# Patient Record
Sex: Female | Born: 1940
Health system: Southern US, Community
[De-identification: ages and names within clinical notes are randomized; demographics above are authoritative.]

---

## 2002-05-25 ENCOUNTER — Emergency Department (HOSPITAL_COMMUNITY): Admission: EM | Admit: 2002-05-25 | Discharge: 2002-05-25 | Payer: Self-pay | Admitting: Emergency Medicine

## 2002-07-27 ENCOUNTER — Emergency Department (HOSPITAL_COMMUNITY): Admission: EM | Admit: 2002-07-27 | Discharge: 2002-07-27 | Payer: Self-pay | Admitting: Emergency Medicine

## 2002-07-27 ENCOUNTER — Encounter: Payer: Self-pay | Admitting: Emergency Medicine

## 2004-08-01 ENCOUNTER — Ambulatory Visit (HOSPITAL_COMMUNITY): Admission: RE | Admit: 2004-08-01 | Discharge: 2004-08-01 | Payer: Self-pay | Admitting: Gastroenterology

## 2004-09-01 ENCOUNTER — Emergency Department (HOSPITAL_COMMUNITY): Admission: EM | Admit: 2004-09-01 | Discharge: 2004-09-02 | Payer: Self-pay | Admitting: Emergency Medicine

## 2004-11-20 ENCOUNTER — Emergency Department (HOSPITAL_COMMUNITY): Admission: EM | Admit: 2004-11-20 | Discharge: 2004-11-20 | Payer: Self-pay | Admitting: Emergency Medicine

## 2008-03-03 ENCOUNTER — Encounter: Admission: RE | Admit: 2008-03-03 | Discharge: 2008-03-03 | Payer: Self-pay | Admitting: Sports Medicine

## 2008-07-29 ENCOUNTER — Encounter: Admission: RE | Admit: 2008-07-29 | Discharge: 2008-07-29 | Payer: Self-pay | Admitting: Sports Medicine

## 2008-08-10 ENCOUNTER — Encounter: Admission: RE | Admit: 2008-08-10 | Discharge: 2008-08-10 | Payer: Self-pay | Admitting: Sports Medicine

## 2010-08-03 NOTE — Op Note (Signed)
NAME:  Autumn Cunningham, Autumn Cunningham NO.:  [id number]   MEDICAL RECORD NO.:  [id number]          PATIENT TYPE:  AMB   LOCATION:  ENDO                         FACILITY:  [facility]   PHYSICIAN:  Anselmo Rod, M.D.  DATE OF BIRTH:  12-15-1940   DATE OF PROCEDURE:  08/01/2004  DATE OF DISCHARGE:                                 OPERATIVE REPORT   PROCEDURE PERFORMED:  Screening colonoscopy.   ENDOSCOPIST:  Anselmo Rod, M.D.   INSTRUMENT USED:  Olympus video colonoscope.   INDICATION FOR PROCEDURE:  A 70 year old white female with a family history  of colon cancer undergoing screening colonoscopy to rule out colonic polyps,  masses, etc.   PREPROCEDURE PREPARATION:  Informed consent was procured from the patient.  The patient was fasted for eight hours prior to the procedure and prepped  with a bottle of magnesium citrate and a gallon of GoLYTELY the night prior  to the procedure.  The risks and benefits of the procedure, including a 10%  miss rate of cancer and polyps, were discussed with the patient as well.   PREPROCEDURE PHYSICAL:  VITAL SIGNS:  The patient had stable vital signs.  NECK:  Supple.  CHEST:  Clear to auscultation.  S1, S2 regular.  ABDOMEN:  Soft with normal bowel sounds.   DESCRIPTION OF PROCEDURE:  The patient was placed in the left lateral  decubitus position and sedated with 75 mg of Demerol and 5 mg of Versed in  slow incremental doses.  Once the patient was adequately sedate and  maintained on low-flow oxygen and continuous cardiac monitoring, the Olympus  video colonoscope was advanced from the rectum to the cecum.  There was some  residual stool in the colon.  Multiple washes were done.  No masses, polyps,  erosions, ulcerations or diverticula were seen.  Small internal hemorrhoids  were appreciated on retroflexion in the rectum.  The patient tolerated the  procedure well without complications.   IMPRESSION:  1. Unrevealing colonoscopy up to the  cecum except for small internal      hemorrhoids.  2. Some residual stool in the colon, very small lesions could be missed.     RECOMMENDATIONS:  1. Continue a high-fiber diet with liberal fluid intake.  2. Repeat colonoscopy in the next five years unless the patient develops      any abnormal symptoms in the interim.  3. Outpatient follow-up as the need arises in the future.        JNM/MEDQ  D:  08/01/2004  T:  08/01/2004  Job:  [id number]   cc:   Teena Irani. Arlyce Dice, M.D.  [address]  [place]  [place] [address]  Fax: [phone]   Rema Fendt, P.A.-C.  [facility]

## 2011-01-17 ENCOUNTER — Other Ambulatory Visit: Payer: Self-pay | Admitting: Sports Medicine

## 2011-01-17 DIAGNOSIS — M5416 Radiculopathy, lumbar region: Secondary | ICD-10-CM

## 2011-01-21 ENCOUNTER — Ambulatory Visit
Admission: RE | Admit: 2011-01-21 | Discharge: 2011-01-21 | Disposition: A | Discharge status: Home / Self Care | Payer: Medicare Other | Source: Ambulatory Visit | Attending: Sports Medicine | Admitting: Sports Medicine

## 2011-01-21 DIAGNOSIS — M5416 Radiculopathy, lumbar region: Secondary | ICD-10-CM

## 2011-01-21 MED ORDER — METHYLPREDNISOLONE ACETATE 40 MG/ML INJ SUSP (RADIOLOG
120.0000 mg | Freq: Once | INTRAMUSCULAR | Status: AC
  Administered 2011-01-21: 120 mg via EPIDURAL

## 2011-01-21 MED ORDER — IOHEXOL 180 MG/ML  SOLN
1.0000 mL | Freq: Once | INTRAMUSCULAR | Status: AC | PRN
  Administered 2011-01-21: 1 mL via EPIDURAL

## 2011-05-07 ENCOUNTER — Other Ambulatory Visit: Payer: Self-pay | Admitting: Sports Medicine

## 2011-05-07 DIAGNOSIS — M5416 Radiculopathy, lumbar region: Secondary | ICD-10-CM

## 2011-05-13 ENCOUNTER — Ambulatory Visit
Admission: RE | Admit: 2011-05-13 | Discharge: 2011-05-13 | Disposition: A | Discharge status: Home / Self Care | Payer: Medicare Other | Source: Ambulatory Visit | Attending: Sports Medicine | Admitting: Sports Medicine

## 2011-05-13 DIAGNOSIS — M5416 Radiculopathy, lumbar region: Secondary | ICD-10-CM

## 2012-02-10 ENCOUNTER — Other Ambulatory Visit: Payer: Self-pay | Admitting: Orthopedic Surgery

## 2012-02-10 ENCOUNTER — Ambulatory Visit (INDEPENDENT_AMBULATORY_CARE_PROVIDER_SITE_OTHER): Payer: Medicare Other

## 2012-02-10 DIAGNOSIS — R52 Pain, unspecified: Secondary | ICD-10-CM

## 2012-02-10 DIAGNOSIS — R29898 Other symptoms and signs involving the musculoskeletal system: Secondary | ICD-10-CM

## 2012-02-10 DIAGNOSIS — M4120 Other idiopathic scoliosis, site unspecified: Secondary | ICD-10-CM

## 2012-02-10 DIAGNOSIS — M545 Low back pain: Secondary | ICD-10-CM

## 2014-05-18 DIAGNOSIS — Z1589 Genetic susceptibility to other disease: Secondary | ICD-10-CM | POA: Diagnosis not present

## 2014-05-18 DIAGNOSIS — M79643 Pain in unspecified hand: Secondary | ICD-10-CM | POA: Diagnosis not present

## 2014-05-18 DIAGNOSIS — M2550 Pain in unspecified joint: Secondary | ICD-10-CM | POA: Diagnosis not present

## 2014-05-24 DIAGNOSIS — N390 Urinary tract infection, site not specified: Secondary | ICD-10-CM | POA: Diagnosis not present

## 2014-05-27 DIAGNOSIS — M79643 Pain in unspecified hand: Secondary | ICD-10-CM | POA: Diagnosis not present

## 2014-05-27 DIAGNOSIS — Z1589 Genetic susceptibility to other disease: Secondary | ICD-10-CM | POA: Diagnosis not present

## 2014-05-27 DIAGNOSIS — M2550 Pain in unspecified joint: Secondary | ICD-10-CM | POA: Diagnosis not present

## 2014-06-06 DIAGNOSIS — N390 Urinary tract infection, site not specified: Secondary | ICD-10-CM | POA: Diagnosis not present

## 2014-06-07 DIAGNOSIS — M79643 Pain in unspecified hand: Secondary | ICD-10-CM | POA: Diagnosis not present

## 2014-06-07 DIAGNOSIS — Z1589 Genetic susceptibility to other disease: Secondary | ICD-10-CM | POA: Diagnosis not present

## 2014-06-07 DIAGNOSIS — M2550 Pain in unspecified joint: Secondary | ICD-10-CM | POA: Diagnosis not present

## 2014-08-02 DIAGNOSIS — M79643 Pain in unspecified hand: Secondary | ICD-10-CM | POA: Diagnosis not present

## 2014-08-02 DIAGNOSIS — M2550 Pain in unspecified joint: Secondary | ICD-10-CM | POA: Diagnosis not present

## 2014-08-02 DIAGNOSIS — M4690 Unspecified inflammatory spondylopathy, site unspecified: Secondary | ICD-10-CM | POA: Diagnosis not present

## 2014-08-02 DIAGNOSIS — Z1589 Genetic susceptibility to other disease: Secondary | ICD-10-CM | POA: Diagnosis not present

## 2014-08-05 DIAGNOSIS — M810 Age-related osteoporosis without current pathological fracture: Secondary | ICD-10-CM | POA: Diagnosis not present

## 2014-08-05 DIAGNOSIS — I10 Essential (primary) hypertension: Secondary | ICD-10-CM | POA: Diagnosis not present

## 2014-08-05 DIAGNOSIS — Z23 Encounter for immunization: Secondary | ICD-10-CM | POA: Diagnosis not present

## 2014-08-05 DIAGNOSIS — R7309 Other abnormal glucose: Secondary | ICD-10-CM | POA: Diagnosis not present

## 2014-08-05 DIAGNOSIS — Z0000 Encounter for general adult medical examination without abnormal findings: Secondary | ICD-10-CM | POA: Diagnosis not present

## 2014-08-05 DIAGNOSIS — E780 Pure hypercholesterolemia: Secondary | ICD-10-CM | POA: Diagnosis not present

## 2014-08-05 DIAGNOSIS — Z1289 Encounter for screening for malignant neoplasm of other sites: Secondary | ICD-10-CM | POA: Diagnosis not present

## 2014-08-05 DIAGNOSIS — Z86010 Personal history of colonic polyps: Secondary | ICD-10-CM | POA: Diagnosis not present

## 2014-08-05 DIAGNOSIS — Z862 Personal history of diseases of the blood and blood-forming organs and certain disorders involving the immune mechanism: Secondary | ICD-10-CM | POA: Diagnosis not present

## 2014-10-04 DIAGNOSIS — M4690 Unspecified inflammatory spondylopathy, site unspecified: Secondary | ICD-10-CM | POA: Diagnosis not present

## 2014-10-04 DIAGNOSIS — M2550 Pain in unspecified joint: Secondary | ICD-10-CM | POA: Diagnosis not present

## 2014-10-04 DIAGNOSIS — Z1589 Genetic susceptibility to other disease: Secondary | ICD-10-CM | POA: Diagnosis not present

## 2014-10-04 DIAGNOSIS — M25569 Pain in unspecified knee: Secondary | ICD-10-CM | POA: Diagnosis not present

## 2014-11-25 DIAGNOSIS — Z23 Encounter for immunization: Secondary | ICD-10-CM | POA: Diagnosis not present

## 2014-12-01 DIAGNOSIS — Z1231 Encounter for screening mammogram for malignant neoplasm of breast: Secondary | ICD-10-CM | POA: Diagnosis not present

## 2014-12-23 DIAGNOSIS — H524 Presbyopia: Secondary | ICD-10-CM | POA: Diagnosis not present

## 2014-12-23 DIAGNOSIS — H35353 Cystoid macular degeneration, bilateral: Secondary | ICD-10-CM | POA: Diagnosis not present

## 2014-12-23 DIAGNOSIS — H04123 Dry eye syndrome of bilateral lacrimal glands: Secondary | ICD-10-CM | POA: Diagnosis not present

## 2015-01-02 DIAGNOSIS — M4690 Unspecified inflammatory spondylopathy, site unspecified: Secondary | ICD-10-CM | POA: Diagnosis not present

## 2015-01-02 DIAGNOSIS — Z79899 Other long term (current) drug therapy: Secondary | ICD-10-CM | POA: Diagnosis not present

## 2015-01-02 DIAGNOSIS — M2550 Pain in unspecified joint: Secondary | ICD-10-CM | POA: Diagnosis not present

## 2015-01-02 DIAGNOSIS — Z1589 Genetic susceptibility to other disease: Secondary | ICD-10-CM | POA: Diagnosis not present

## 2015-02-02 DIAGNOSIS — M0609 Rheumatoid arthritis without rheumatoid factor, multiple sites: Secondary | ICD-10-CM | POA: Diagnosis not present

## 2015-02-02 DIAGNOSIS — M4690 Unspecified inflammatory spondylopathy, site unspecified: Secondary | ICD-10-CM | POA: Diagnosis not present

## 2015-02-02 DIAGNOSIS — Z9229 Personal history of other drug therapy: Secondary | ICD-10-CM | POA: Diagnosis not present

## 2015-02-06 DIAGNOSIS — E7800 Pure hypercholesterolemia, unspecified: Secondary | ICD-10-CM | POA: Diagnosis not present

## 2015-02-06 DIAGNOSIS — R7309 Other abnormal glucose: Secondary | ICD-10-CM | POA: Diagnosis not present

## 2015-02-06 DIAGNOSIS — M810 Age-related osteoporosis without current pathological fracture: Secondary | ICD-10-CM | POA: Diagnosis not present

## 2015-02-06 DIAGNOSIS — I10 Essential (primary) hypertension: Secondary | ICD-10-CM | POA: Diagnosis not present

## 2015-02-06 DIAGNOSIS — M170 Bilateral primary osteoarthritis of knee: Secondary | ICD-10-CM | POA: Diagnosis not present

## 2015-03-06 DIAGNOSIS — Z1589 Genetic susceptibility to other disease: Secondary | ICD-10-CM | POA: Diagnosis not present

## 2015-03-06 DIAGNOSIS — Z79899 Other long term (current) drug therapy: Secondary | ICD-10-CM | POA: Diagnosis not present

## 2015-03-06 DIAGNOSIS — M4690 Unspecified inflammatory spondylopathy, site unspecified: Secondary | ICD-10-CM | POA: Diagnosis not present

## 2015-03-06 DIAGNOSIS — G8929 Other chronic pain: Secondary | ICD-10-CM | POA: Diagnosis not present

## 2015-03-06 DIAGNOSIS — M2550 Pain in unspecified joint: Secondary | ICD-10-CM | POA: Diagnosis not present

## 2015-03-08 DIAGNOSIS — N390 Urinary tract infection, site not specified: Secondary | ICD-10-CM | POA: Diagnosis not present

## 2015-03-08 DIAGNOSIS — M8580 Other specified disorders of bone density and structure, unspecified site: Secondary | ICD-10-CM | POA: Diagnosis not present

## 2015-03-08 DIAGNOSIS — M79672 Pain in left foot: Secondary | ICD-10-CM | POA: Diagnosis not present

## 2015-03-08 DIAGNOSIS — K219 Gastro-esophageal reflux disease without esophagitis: Secondary | ICD-10-CM | POA: Diagnosis not present

## 2015-03-08 DIAGNOSIS — R300 Dysuria: Secondary | ICD-10-CM | POA: Diagnosis not present

## 2015-03-08 DIAGNOSIS — R319 Hematuria, unspecified: Secondary | ICD-10-CM | POA: Diagnosis not present

## 2015-04-13 DIAGNOSIS — R300 Dysuria: Secondary | ICD-10-CM | POA: Diagnosis not present

## 2015-04-13 DIAGNOSIS — R3915 Urgency of urination: Secondary | ICD-10-CM | POA: Diagnosis not present

## 2015-04-13 DIAGNOSIS — N390 Urinary tract infection, site not specified: Secondary | ICD-10-CM | POA: Diagnosis not present

## 2015-04-13 DIAGNOSIS — R350 Frequency of micturition: Secondary | ICD-10-CM | POA: Diagnosis not present

## 2015-04-28 DIAGNOSIS — R300 Dysuria: Secondary | ICD-10-CM | POA: Diagnosis not present

## 2015-04-28 DIAGNOSIS — N390 Urinary tract infection, site not specified: Secondary | ICD-10-CM | POA: Diagnosis not present

## 2015-04-28 DIAGNOSIS — R350 Frequency of micturition: Secondary | ICD-10-CM | POA: Diagnosis not present

## 2015-04-28 DIAGNOSIS — R828 Abnormal findings on cytological and histological examination of urine: Secondary | ICD-10-CM | POA: Diagnosis not present

## 2015-04-28 DIAGNOSIS — R3915 Urgency of urination: Secondary | ICD-10-CM | POA: Diagnosis not present

## 2015-06-20 DIAGNOSIS — M2550 Pain in unspecified joint: Secondary | ICD-10-CM | POA: Diagnosis not present

## 2015-06-20 DIAGNOSIS — M4690 Unspecified inflammatory spondylopathy, site unspecified: Secondary | ICD-10-CM | POA: Diagnosis not present

## 2015-06-20 DIAGNOSIS — G8929 Other chronic pain: Secondary | ICD-10-CM | POA: Diagnosis not present

## 2015-06-20 DIAGNOSIS — Z79899 Other long term (current) drug therapy: Secondary | ICD-10-CM | POA: Diagnosis not present

## 2015-08-03 DIAGNOSIS — Z0000 Encounter for general adult medical examination without abnormal findings: Secondary | ICD-10-CM | POA: Diagnosis not present

## 2015-08-03 DIAGNOSIS — I10 Essential (primary) hypertension: Secondary | ICD-10-CM | POA: Diagnosis not present

## 2015-08-03 DIAGNOSIS — R5383 Other fatigue: Secondary | ICD-10-CM | POA: Diagnosis not present

## 2015-08-03 DIAGNOSIS — M899 Disorder of bone, unspecified: Secondary | ICD-10-CM | POA: Diagnosis not present

## 2015-08-03 DIAGNOSIS — E7800 Pure hypercholesterolemia, unspecified: Secondary | ICD-10-CM | POA: Diagnosis not present

## 2015-08-03 DIAGNOSIS — M810 Age-related osteoporosis without current pathological fracture: Secondary | ICD-10-CM | POA: Diagnosis not present

## 2015-08-03 DIAGNOSIS — Z1289 Encounter for screening for malignant neoplasm of other sites: Secondary | ICD-10-CM | POA: Diagnosis not present

## 2015-08-03 DIAGNOSIS — R7309 Other abnormal glucose: Secondary | ICD-10-CM | POA: Diagnosis not present

## 2015-09-20 DIAGNOSIS — Z79899 Other long term (current) drug therapy: Secondary | ICD-10-CM | POA: Diagnosis not present

## 2015-09-20 DIAGNOSIS — M2550 Pain in unspecified joint: Secondary | ICD-10-CM | POA: Diagnosis not present

## 2015-09-20 DIAGNOSIS — M4690 Unspecified inflammatory spondylopathy, site unspecified: Secondary | ICD-10-CM | POA: Diagnosis not present

## 2015-09-20 DIAGNOSIS — Z1211 Encounter for screening for malignant neoplasm of colon: Secondary | ICD-10-CM | POA: Diagnosis not present

## 2015-10-24 DIAGNOSIS — M4690 Unspecified inflammatory spondylopathy, site unspecified: Secondary | ICD-10-CM | POA: Diagnosis not present

## 2015-11-21 DIAGNOSIS — N3001 Acute cystitis with hematuria: Secondary | ICD-10-CM | POA: Diagnosis not present

## 2015-12-04 DIAGNOSIS — M810 Age-related osteoporosis without current pathological fracture: Secondary | ICD-10-CM | POA: Diagnosis not present

## 2015-12-04 DIAGNOSIS — Z1231 Encounter for screening mammogram for malignant neoplasm of breast: Secondary | ICD-10-CM | POA: Diagnosis not present

## 2015-12-04 DIAGNOSIS — Z780 Asymptomatic menopausal state: Secondary | ICD-10-CM | POA: Diagnosis not present

## 2015-12-19 DIAGNOSIS — Z79899 Other long term (current) drug therapy: Secondary | ICD-10-CM | POA: Diagnosis not present

## 2015-12-19 DIAGNOSIS — M4690 Unspecified inflammatory spondylopathy, site unspecified: Secondary | ICD-10-CM | POA: Diagnosis not present

## 2015-12-19 DIAGNOSIS — M25561 Pain in right knee: Secondary | ICD-10-CM | POA: Diagnosis not present

## 2015-12-19 DIAGNOSIS — M2550 Pain in unspecified joint: Secondary | ICD-10-CM | POA: Diagnosis not present

## 2015-12-22 DIAGNOSIS — Z23 Encounter for immunization: Secondary | ICD-10-CM | POA: Diagnosis not present

## 2016-01-29 DIAGNOSIS — M4690 Unspecified inflammatory spondylopathy, site unspecified: Secondary | ICD-10-CM | POA: Diagnosis not present

## 2016-01-29 DIAGNOSIS — M810 Age-related osteoporosis without current pathological fracture: Secondary | ICD-10-CM | POA: Diagnosis not present

## 2016-01-29 DIAGNOSIS — E7800 Pure hypercholesterolemia, unspecified: Secondary | ICD-10-CM | POA: Diagnosis not present

## 2016-01-29 DIAGNOSIS — I10 Essential (primary) hypertension: Secondary | ICD-10-CM | POA: Diagnosis not present

## 2016-03-20 DIAGNOSIS — M4690 Unspecified inflammatory spondylopathy, site unspecified: Secondary | ICD-10-CM | POA: Diagnosis not present

## 2016-03-20 DIAGNOSIS — M170 Bilateral primary osteoarthritis of knee: Secondary | ICD-10-CM | POA: Diagnosis not present

## 2016-06-18 DIAGNOSIS — M4690 Unspecified inflammatory spondylopathy, site unspecified: Secondary | ICD-10-CM | POA: Diagnosis not present

## 2016-06-18 DIAGNOSIS — M2550 Pain in unspecified joint: Secondary | ICD-10-CM | POA: Diagnosis not present

## 2016-06-18 DIAGNOSIS — M170 Bilateral primary osteoarthritis of knee: Secondary | ICD-10-CM | POA: Diagnosis not present

## 2016-06-18 DIAGNOSIS — Z79899 Other long term (current) drug therapy: Secondary | ICD-10-CM | POA: Diagnosis not present

## 2016-08-14 DIAGNOSIS — M810 Age-related osteoporosis without current pathological fracture: Secondary | ICD-10-CM | POA: Diagnosis not present

## 2016-08-14 DIAGNOSIS — Z1231 Encounter for screening mammogram for malignant neoplasm of breast: Secondary | ICD-10-CM | POA: Diagnosis not present

## 2016-08-14 DIAGNOSIS — M899 Disorder of bone, unspecified: Secondary | ICD-10-CM | POA: Diagnosis not present

## 2016-08-14 DIAGNOSIS — R739 Hyperglycemia, unspecified: Secondary | ICD-10-CM | POA: Diagnosis not present

## 2016-08-14 DIAGNOSIS — M4690 Unspecified inflammatory spondylopathy, site unspecified: Secondary | ICD-10-CM | POA: Diagnosis not present

## 2016-08-14 DIAGNOSIS — E7800 Pure hypercholesterolemia, unspecified: Secondary | ICD-10-CM | POA: Diagnosis not present

## 2016-08-14 DIAGNOSIS — I10 Essential (primary) hypertension: Secondary | ICD-10-CM | POA: Diagnosis not present

## 2016-08-14 DIAGNOSIS — Z0000 Encounter for general adult medical examination without abnormal findings: Secondary | ICD-10-CM | POA: Diagnosis not present

## 2016-08-16 DIAGNOSIS — R05 Cough: Secondary | ICD-10-CM | POA: Diagnosis not present

## 2016-09-17 DIAGNOSIS — Z79899 Other long term (current) drug therapy: Secondary | ICD-10-CM | POA: Diagnosis not present

## 2016-09-17 DIAGNOSIS — M25562 Pain in left knee: Secondary | ICD-10-CM | POA: Diagnosis not present

## 2016-09-17 DIAGNOSIS — M170 Bilateral primary osteoarthritis of knee: Secondary | ICD-10-CM | POA: Diagnosis not present

## 2016-09-17 DIAGNOSIS — M2550 Pain in unspecified joint: Secondary | ICD-10-CM | POA: Diagnosis not present

## 2016-09-17 DIAGNOSIS — M4690 Unspecified inflammatory spondylopathy, site unspecified: Secondary | ICD-10-CM | POA: Diagnosis not present

## 2016-09-17 DIAGNOSIS — M25561 Pain in right knee: Secondary | ICD-10-CM | POA: Diagnosis not present

## 2016-09-21 DIAGNOSIS — Z79899 Other long term (current) drug therapy: Secondary | ICD-10-CM | POA: Diagnosis not present

## 2016-09-21 DIAGNOSIS — I10 Essential (primary) hypertension: Secondary | ICD-10-CM | POA: Diagnosis not present

## 2016-09-21 DIAGNOSIS — S2232XA Fracture of one rib, left side, initial encounter for closed fracture: Secondary | ICD-10-CM | POA: Diagnosis not present

## 2016-09-21 DIAGNOSIS — N289 Disorder of kidney and ureter, unspecified: Secondary | ICD-10-CM | POA: Diagnosis not present

## 2016-09-21 DIAGNOSIS — R0789 Other chest pain: Secondary | ICD-10-CM | POA: Diagnosis not present

## 2016-09-21 DIAGNOSIS — Z7982 Long term (current) use of aspirin: Secondary | ICD-10-CM | POA: Diagnosis not present

## 2016-09-21 DIAGNOSIS — N2889 Other specified disorders of kidney and ureter: Secondary | ICD-10-CM | POA: Diagnosis not present

## 2016-09-21 DIAGNOSIS — N200 Calculus of kidney: Secondary | ICD-10-CM | POA: Diagnosis not present

## 2016-09-21 DIAGNOSIS — E785 Hyperlipidemia, unspecified: Secondary | ICD-10-CM | POA: Diagnosis not present

## 2016-09-21 DIAGNOSIS — R1032 Left lower quadrant pain: Secondary | ICD-10-CM | POA: Diagnosis not present

## 2016-09-21 DIAGNOSIS — R1012 Left upper quadrant pain: Secondary | ICD-10-CM | POA: Diagnosis not present

## 2016-10-02 DIAGNOSIS — D485 Neoplasm of uncertain behavior of skin: Secondary | ICD-10-CM | POA: Diagnosis not present

## 2016-10-02 DIAGNOSIS — C44311 Basal cell carcinoma of skin of nose: Secondary | ICD-10-CM | POA: Diagnosis not present

## 2016-10-02 DIAGNOSIS — L821 Other seborrheic keratosis: Secondary | ICD-10-CM | POA: Diagnosis not present

## 2016-11-11 DIAGNOSIS — R05 Cough: Secondary | ICD-10-CM | POA: Diagnosis not present

## 2016-11-11 DIAGNOSIS — J209 Acute bronchitis, unspecified: Secondary | ICD-10-CM | POA: Diagnosis not present

## 2016-11-28 DIAGNOSIS — Z23 Encounter for immunization: Secondary | ICD-10-CM | POA: Diagnosis not present

## 2016-12-09 DIAGNOSIS — R05 Cough: Secondary | ICD-10-CM | POA: Diagnosis not present

## 2016-12-09 DIAGNOSIS — R0982 Postnasal drip: Secondary | ICD-10-CM | POA: Diagnosis not present

## 2016-12-13 DIAGNOSIS — C44311 Basal cell carcinoma of skin of nose: Secondary | ICD-10-CM | POA: Diagnosis not present

## 2017-05-29 DIAGNOSIS — M810 Age-related osteoporosis without current pathological fracture: Secondary | ICD-10-CM | POA: Diagnosis not present

## 2017-05-29 DIAGNOSIS — R635 Abnormal weight gain: Secondary | ICD-10-CM | POA: Diagnosis not present

## 2017-05-29 DIAGNOSIS — M4690 Unspecified inflammatory spondylopathy, site unspecified: Secondary | ICD-10-CM | POA: Diagnosis not present

## 2017-05-29 DIAGNOSIS — R5383 Other fatigue: Secondary | ICD-10-CM | POA: Diagnosis not present

## 2017-05-29 DIAGNOSIS — E7800 Pure hypercholesterolemia, unspecified: Secondary | ICD-10-CM | POA: Diagnosis not present

## 2017-05-29 DIAGNOSIS — I10 Essential (primary) hypertension: Secondary | ICD-10-CM | POA: Diagnosis not present

## 2017-07-07 DIAGNOSIS — K5900 Constipation, unspecified: Secondary | ICD-10-CM | POA: Diagnosis not present

## 2017-07-07 DIAGNOSIS — Z1211 Encounter for screening for malignant neoplasm of colon: Secondary | ICD-10-CM | POA: Diagnosis not present

## 2017-07-14 DIAGNOSIS — Z1231 Encounter for screening mammogram for malignant neoplasm of breast: Secondary | ICD-10-CM | POA: Diagnosis not present

## 2017-07-31 DIAGNOSIS — D122 Benign neoplasm of ascending colon: Secondary | ICD-10-CM | POA: Diagnosis not present

## 2017-07-31 DIAGNOSIS — K644 Residual hemorrhoidal skin tags: Secondary | ICD-10-CM | POA: Diagnosis not present

## 2017-07-31 DIAGNOSIS — Z1211 Encounter for screening for malignant neoplasm of colon: Secondary | ICD-10-CM | POA: Diagnosis not present

## 2017-11-02 DIAGNOSIS — I10 Essential (primary) hypertension: Secondary | ICD-10-CM | POA: Diagnosis not present

## 2017-11-02 DIAGNOSIS — Z882 Allergy status to sulfonamides status: Secondary | ICD-10-CM | POA: Diagnosis not present

## 2017-11-02 DIAGNOSIS — E785 Hyperlipidemia, unspecified: Secondary | ICD-10-CM | POA: Diagnosis not present

## 2017-11-02 DIAGNOSIS — Z7982 Long term (current) use of aspirin: Secondary | ICD-10-CM | POA: Diagnosis not present

## 2017-11-02 DIAGNOSIS — R52 Pain, unspecified: Secondary | ICD-10-CM | POA: Diagnosis not present

## 2017-11-02 DIAGNOSIS — W19XXXA Unspecified fall, initial encounter: Secondary | ICD-10-CM | POA: Diagnosis not present

## 2017-11-02 DIAGNOSIS — M5489 Other dorsalgia: Secondary | ICD-10-CM | POA: Diagnosis not present

## 2017-11-02 DIAGNOSIS — R5383 Other fatigue: Secondary | ICD-10-CM | POA: Diagnosis not present

## 2017-11-02 DIAGNOSIS — N3000 Acute cystitis without hematuria: Secondary | ICD-10-CM | POA: Diagnosis not present

## 2017-11-02 DIAGNOSIS — Z8589 Personal history of malignant neoplasm of other organs and systems: Secondary | ICD-10-CM | POA: Diagnosis not present

## 2017-11-02 DIAGNOSIS — Z885 Allergy status to narcotic agent status: Secondary | ICD-10-CM | POA: Diagnosis not present

## 2017-11-02 DIAGNOSIS — Z79899 Other long term (current) drug therapy: Secondary | ICD-10-CM | POA: Diagnosis not present

## 2017-11-05 ENCOUNTER — Other Ambulatory Visit: Payer: Self-pay

## 2017-11-05 ENCOUNTER — Encounter (HOSPITAL_COMMUNITY): Payer: Self-pay | Admitting: Emergency Medicine

## 2017-11-05 ENCOUNTER — Emergency Department (HOSPITAL_COMMUNITY): Payer: Medicare HMO

## 2017-11-05 ENCOUNTER — Emergency Department (HOSPITAL_COMMUNITY)
Admission: EM | Admit: 2017-11-05 | Discharge: 2017-11-05 | Disposition: A | Discharge status: Home / Self Care | Payer: Medicare HMO | Attending: Emergency Medicine | Admitting: Emergency Medicine

## 2017-11-05 DIAGNOSIS — M47816 Spondylosis without myelopathy or radiculopathy, lumbar region: Secondary | ICD-10-CM

## 2017-11-05 DIAGNOSIS — Z79899 Other long term (current) drug therapy: Secondary | ICD-10-CM | POA: Insufficient documentation

## 2017-11-05 DIAGNOSIS — M25552 Pain in left hip: Secondary | ICD-10-CM | POA: Diagnosis not present

## 2017-11-05 DIAGNOSIS — Z7982 Long term (current) use of aspirin: Secondary | ICD-10-CM | POA: Diagnosis not present

## 2017-11-05 DIAGNOSIS — R609 Edema, unspecified: Secondary | ICD-10-CM | POA: Diagnosis not present

## 2017-11-05 DIAGNOSIS — R52 Pain, unspecified: Secondary | ICD-10-CM | POA: Diagnosis not present

## 2017-11-05 DIAGNOSIS — M4696 Unspecified inflammatory spondylopathy, lumbar region: Secondary | ICD-10-CM | POA: Diagnosis not present

## 2017-11-05 DIAGNOSIS — M549 Dorsalgia, unspecified: Secondary | ICD-10-CM | POA: Diagnosis not present

## 2017-11-05 DIAGNOSIS — M5489 Other dorsalgia: Secondary | ICD-10-CM | POA: Diagnosis not present

## 2017-11-05 DIAGNOSIS — I10 Essential (primary) hypertension: Secondary | ICD-10-CM | POA: Diagnosis not present

## 2017-11-05 DIAGNOSIS — R0902 Hypoxemia: Secondary | ICD-10-CM | POA: Diagnosis not present

## 2017-11-05 DIAGNOSIS — S79912A Unspecified injury of left hip, initial encounter: Secondary | ICD-10-CM | POA: Diagnosis not present

## 2017-11-05 LAB — BASIC METABOLIC PANEL
Anion gap: 9 (ref 5–15)
BUN: 16 mg/dL (ref 8–23)
CALCIUM: 8.5 mg/dL — AB (ref 8.9–10.3)
CO2: 26 mmol/L (ref 22–32)
CREATININE: 0.81 mg/dL (ref 0.44–1.00)
Chloride: 102 mmol/L (ref 98–111)
GFR calc non Af Amer: >60 mL/min (ref >60)
Glucose, Bld: 128 mg/dL — ABNORMAL HIGH (ref 70–99)
Potassium: 3.4 mmol/L — ABNORMAL LOW (ref 3.5–5.1)
SODIUM: 137 mmol/L (ref 135–145)

## 2017-11-05 LAB — URINALYSIS, ROUTINE W REFLEX MICROSCOPIC
Bilirubin Urine: NEGATIVE
GLUCOSE, UA: NEGATIVE mg/dL
Hgb urine dipstick: NEGATIVE
Ketones, ur: NEGATIVE mg/dL
NITRITE: NEGATIVE
PH: 6 (ref 5.0–8.0)
Protein, ur: NEGATIVE mg/dL
SPECIFIC GRAVITY, URINE: 1.012 (ref 1.005–1.030)
WBC, UA: >50 WBC/hpf — ABNORMAL HIGH (ref 0–5)

## 2017-11-05 LAB — CK: Total CK: 34 U/L — ABNORMAL LOW (ref 38–234)

## 2017-11-05 LAB — CBC WITH DIFFERENTIAL/PLATELET
Abs Immature Granulocytes: 0.1 10*3/uL (ref 0.0–0.1)
BASOS PCT: 0 %
Basophils Absolute: 0.1 10*3/uL (ref 0.0–0.1)
EOS ABS: 0.1 10*3/uL (ref 0.0–0.7)
EOS PCT: 0 %
HEMATOCRIT: 37.5 % (ref 36.0–46.0)
Hemoglobin: 11.9 g/dL — ABNORMAL LOW (ref 12.0–15.0)
IMMATURE GRANULOCYTES: 1 %
LYMPHS ABS: 1.4 10*3/uL (ref 0.7–4.0)
Lymphocytes Relative: 9 %
MCH: 29.2 pg (ref 26.0–34.0)
MCHC: 31.7 g/dL (ref 30.0–36.0)
MCV: 91.9 fL (ref 78.0–100.0)
Monocytes Absolute: 1 10*3/uL (ref 0.1–1.0)
Monocytes Relative: 7 %
NEUTROS PCT: 83 %
Neutro Abs: 12.4 10*3/uL — ABNORMAL HIGH (ref 1.7–7.7)
PLATELETS: 251 10*3/uL (ref 150–400)
RBC: 4.08 MIL/uL (ref 3.87–5.11)
RDW: 13.2 % (ref 11.5–15.5)
WBC: 15.1 10*3/uL — AB (ref 4.0–10.5)

## 2017-11-05 MED ORDER — FENTANYL CITRATE (PF) 100 MCG/2ML IJ SOLN
50.0000 ug | Freq: Once | INTRAMUSCULAR | Status: AC
  Administered 2017-11-05: 50 ug via INTRAVENOUS
  Filled 2017-11-05: qty 2

## 2017-11-05 MED ORDER — SODIUM CHLORIDE 0.9 % IV BOLUS
500.0000 mL | Freq: Once | INTRAVENOUS | Status: AC
  Administered 2017-11-05: 500 mL via INTRAVENOUS

## 2017-11-05 MED ORDER — HYDROCODONE-ACETAMINOPHEN 5-325 MG PO TABS
1.0000 | ORAL_TABLET | Freq: Once | ORAL | Status: AC
  Administered 2017-11-05: 1 via ORAL
  Filled 2017-11-05: qty 1

## 2017-11-05 MED ORDER — HYDROCODONE-ACETAMINOPHEN 5-325 MG PO TABS: 1.0000 | ORAL_TABLET | Freq: Four times a day (QID) | ORAL | 0 refills | Status: AC | PRN

## 2017-11-05 MED ORDER — HYDROCODONE-ACETAMINOPHEN 5-325 MG PO TABS: 1.0000 | ORAL_TABLET | Freq: Four times a day (QID) | ORAL | 0 refills | Status: DC | PRN

## 2017-11-05 NOTE — ED Notes (Signed)
Got patient undress on the monitor patient is resting with call bell in reach 

## 2017-11-05 NOTE — ED Notes (Signed)
Out for imaging. °

## 2017-11-05 NOTE — Discharge Instructions (Addendum)
If your back pain worsens, you are unable to walk or have weakness in the legs, incontinence, fever, vomiting, or any other new/concerning symptoms and return to the ER for evaluation.

## 2017-11-05 NOTE — ED Notes (Signed)
Patient transported to X-ray 

## 2017-11-05 NOTE — ED Notes (Addendum)
Post norco ambulation. Pt stands on own with use of a walker. Pt is strong on feet and supports self with walker. Moves slowly with short steps for about 20 paces. Pt reports movement is much easier.

## 2017-11-05 NOTE — ED Notes (Signed)
Discharge instructions and prescriptions discussed with Pt. Pt verbalized understanding. Pt stable and ambulatory.   

## 2017-11-05 NOTE — ED Notes (Signed)
Pt assisted in standing with walker. Pt is cramping and reports sharp pains in left hip entire time. Needs assistance x2 people sitting up in bed. Assistance x2 people standing. Can support self with walker and takes several small steps forward and then back. Very unsteady in movement, but strong in supporting self.
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
## 2017-11-05 NOTE — ED Notes (Signed)
Bladder scan volume 156 ml
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
## 2017-11-05 NOTE — ED Notes (Signed)
Patient transported to MRI 

## 2017-11-07 LAB — URINE CULTURE: Culture: <10000 — AB

## 2017-11-18 DIAGNOSIS — Z1231 Encounter for screening mammogram for malignant neoplasm of breast: Secondary | ICD-10-CM | POA: Diagnosis not present

## 2017-11-18 DIAGNOSIS — M170 Bilateral primary osteoarthritis of knee: Secondary | ICD-10-CM | POA: Diagnosis not present

## 2017-11-18 DIAGNOSIS — Z23 Encounter for immunization: Secondary | ICD-10-CM | POA: Diagnosis not present

## 2017-11-18 DIAGNOSIS — M810 Age-related osteoporosis without current pathological fracture: Secondary | ICD-10-CM | POA: Diagnosis not present

## 2017-11-18 DIAGNOSIS — M4690 Unspecified inflammatory spondylopathy, site unspecified: Secondary | ICD-10-CM | POA: Diagnosis not present

## 2017-11-18 DIAGNOSIS — Z0000 Encounter for general adult medical examination without abnormal findings: Secondary | ICD-10-CM | POA: Diagnosis not present

## 2017-11-18 DIAGNOSIS — M899 Disorder of bone, unspecified: Secondary | ICD-10-CM | POA: Diagnosis not present

## 2017-11-18 DIAGNOSIS — I10 Essential (primary) hypertension: Secondary | ICD-10-CM | POA: Diagnosis not present

## 2017-11-18 DIAGNOSIS — R634 Abnormal weight loss: Secondary | ICD-10-CM | POA: Diagnosis not present

## 2017-11-18 DIAGNOSIS — R5383 Other fatigue: Secondary | ICD-10-CM | POA: Diagnosis not present

## 2017-11-18 DIAGNOSIS — E7800 Pure hypercholesterolemia, unspecified: Secondary | ICD-10-CM | POA: Diagnosis not present

## 2017-11-21 DIAGNOSIS — M5136 Other intervertebral disc degeneration, lumbar region: Secondary | ICD-10-CM | POA: Diagnosis not present

## 2017-11-21 DIAGNOSIS — M545 Low back pain: Secondary | ICD-10-CM | POA: Diagnosis not present

## 2017-11-21 DIAGNOSIS — M1711 Unilateral primary osteoarthritis, right knee: Secondary | ICD-10-CM | POA: Diagnosis not present

## 2017-11-21 DIAGNOSIS — M25561 Pain in right knee: Secondary | ICD-10-CM | POA: Diagnosis not present

## 2017-12-08 DIAGNOSIS — R7303 Prediabetes: Secondary | ICD-10-CM | POA: Diagnosis not present

## 2017-12-31 DIAGNOSIS — Z5181 Encounter for therapeutic drug level monitoring: Secondary | ICD-10-CM | POA: Diagnosis not present

## 2018-03-16 DIAGNOSIS — M25561 Pain in right knee: Secondary | ICD-10-CM | POA: Diagnosis not present

## 2018-06-03 DIAGNOSIS — E7800 Pure hypercholesterolemia, unspecified: Secondary | ICD-10-CM | POA: Diagnosis not present

## 2018-06-03 DIAGNOSIS — R5383 Other fatigue: Secondary | ICD-10-CM | POA: Diagnosis not present

## 2018-06-03 DIAGNOSIS — R634 Abnormal weight loss: Secondary | ICD-10-CM | POA: Diagnosis not present

## 2018-06-03 DIAGNOSIS — M4690 Unspecified inflammatory spondylopathy, site unspecified: Secondary | ICD-10-CM | POA: Diagnosis not present

## 2018-06-03 DIAGNOSIS — I10 Essential (primary) hypertension: Secondary | ICD-10-CM | POA: Diagnosis not present

## 2018-06-03 DIAGNOSIS — R739 Hyperglycemia, unspecified: Secondary | ICD-10-CM | POA: Diagnosis not present

## 2018-06-16 DIAGNOSIS — M25561 Pain in right knee: Secondary | ICD-10-CM | POA: Diagnosis not present

## 2018-08-16 DIAGNOSIS — Z79899 Other long term (current) drug therapy: Secondary | ICD-10-CM | POA: Diagnosis not present

## 2018-08-16 DIAGNOSIS — Z882 Allergy status to sulfonamides status: Secondary | ICD-10-CM | POA: Diagnosis not present

## 2018-08-16 DIAGNOSIS — Z885 Allergy status to narcotic agent status: Secondary | ICD-10-CM | POA: Diagnosis not present

## 2018-08-16 DIAGNOSIS — S51812A Laceration without foreign body of left forearm, initial encounter: Secondary | ICD-10-CM | POA: Diagnosis not present

## 2018-08-16 DIAGNOSIS — Z7982 Long term (current) use of aspirin: Secondary | ICD-10-CM | POA: Diagnosis not present

## 2018-08-16 DIAGNOSIS — E785 Hyperlipidemia, unspecified: Secondary | ICD-10-CM | POA: Diagnosis not present

## 2018-08-16 DIAGNOSIS — I10 Essential (primary) hypertension: Secondary | ICD-10-CM | POA: Diagnosis not present

## 2018-08-16 DIAGNOSIS — S51802A Unspecified open wound of left forearm, initial encounter: Secondary | ICD-10-CM | POA: Diagnosis not present

## 2018-12-16 DIAGNOSIS — M25562 Pain in left knee: Secondary | ICD-10-CM | POA: Diagnosis not present

## 2018-12-16 DIAGNOSIS — R739 Hyperglycemia, unspecified: Secondary | ICD-10-CM | POA: Diagnosis not present

## 2018-12-16 DIAGNOSIS — Z0000 Encounter for general adult medical examination without abnormal findings: Secondary | ICD-10-CM | POA: Diagnosis not present

## 2018-12-16 DIAGNOSIS — E7800 Pure hypercholesterolemia, unspecified: Secondary | ICD-10-CM | POA: Diagnosis not present

## 2018-12-16 DIAGNOSIS — M810 Age-related osteoporosis without current pathological fracture: Secondary | ICD-10-CM | POA: Diagnosis not present

## 2018-12-16 DIAGNOSIS — Z23 Encounter for immunization: Secondary | ICD-10-CM | POA: Diagnosis not present

## 2018-12-16 DIAGNOSIS — I10 Essential (primary) hypertension: Secondary | ICD-10-CM | POA: Diagnosis not present

## 2018-12-16 DIAGNOSIS — M899 Disorder of bone, unspecified: Secondary | ICD-10-CM | POA: Diagnosis not present

## 2018-12-16 DIAGNOSIS — M25561 Pain in right knee: Secondary | ICD-10-CM | POA: Diagnosis not present

## 2018-12-16 DIAGNOSIS — R5383 Other fatigue: Secondary | ICD-10-CM | POA: Diagnosis not present

## 2018-12-16 DIAGNOSIS — R634 Abnormal weight loss: Secondary | ICD-10-CM | POA: Diagnosis not present

## 2018-12-16 DIAGNOSIS — M4690 Unspecified inflammatory spondylopathy, site unspecified: Secondary | ICD-10-CM | POA: Diagnosis not present

## 2019-01-27 DIAGNOSIS — R42 Dizziness and giddiness: Secondary | ICD-10-CM | POA: Diagnosis not present

## 2019-01-27 DIAGNOSIS — W19XXXA Unspecified fall, initial encounter: Secondary | ICD-10-CM | POA: Diagnosis not present

## 2019-01-27 DIAGNOSIS — S0990XA Unspecified injury of head, initial encounter: Secondary | ICD-10-CM | POA: Diagnosis not present

## 2019-01-27 DIAGNOSIS — N390 Urinary tract infection, site not specified: Secondary | ICD-10-CM | POA: Diagnosis not present

## 2019-01-27 DIAGNOSIS — Z882 Allergy status to sulfonamides status: Secondary | ICD-10-CM | POA: Diagnosis not present

## 2019-01-27 DIAGNOSIS — R079 Chest pain, unspecified: Secondary | ICD-10-CM | POA: Diagnosis not present

## 2019-01-27 DIAGNOSIS — Z79899 Other long term (current) drug therapy: Secondary | ICD-10-CM | POA: Diagnosis not present

## 2019-01-27 DIAGNOSIS — T300 Burn of unspecified body region, unspecified degree: Secondary | ICD-10-CM | POA: Diagnosis not present

## 2019-01-27 DIAGNOSIS — T1490XA Injury, unspecified, initial encounter: Secondary | ICD-10-CM | POA: Diagnosis not present

## 2019-01-27 DIAGNOSIS — I10 Essential (primary) hypertension: Secondary | ICD-10-CM | POA: Diagnosis not present

## 2019-01-27 DIAGNOSIS — Z7982 Long term (current) use of aspirin: Secondary | ICD-10-CM | POA: Diagnosis not present

## 2019-01-27 DIAGNOSIS — Z885 Allergy status to narcotic agent status: Secondary | ICD-10-CM | POA: Diagnosis not present

## 2019-01-27 DIAGNOSIS — E785 Hyperlipidemia, unspecified: Secondary | ICD-10-CM | POA: Diagnosis not present
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
